# Patient Record
Sex: Female | Born: 1975 | Race: White | Hispanic: No | Marital: Married | Smoking: Current every day smoker
Health system: Southern US, Community
[De-identification: ages and names within clinical notes are randomized; demographics above are authoritative.]

## PROBLEM LIST (undated history)

## (undated) DIAGNOSIS — M5136 Other intervertebral disc degeneration, lumbar region: Secondary | ICD-10-CM

## (undated) DIAGNOSIS — M51369 Other intervertebral disc degeneration, lumbar region without mention of lumbar back pain or lower extremity pain: Secondary | ICD-10-CM

## (undated) HISTORY — PX: CARPAL TUNNEL RELEASE: SHX101

## (undated) HISTORY — PX: TUBAL LIGATION: SHX77

## (undated) HISTORY — PX: CERVIX LESION DESTRUCTION: SHX591

## (undated) HISTORY — PX: GASTRIC BYPASS: SHX52

---

## 2002-03-26 ENCOUNTER — Encounter: Admission: RE | Admit: 2002-03-26 | Discharge: 2002-03-26 | Payer: Self-pay | Admitting: *Deleted

## 2002-03-29 ENCOUNTER — Encounter: Admission: RE | Admit: 2002-03-29 | Discharge: 2002-06-27 | Payer: Self-pay | Admitting: *Deleted

## 2002-06-28 ENCOUNTER — Encounter: Admission: RE | Admit: 2002-06-28 | Discharge: 2002-06-28 | Payer: Self-pay | Admitting: *Deleted

## 2002-07-02 ENCOUNTER — Inpatient Hospital Stay (HOSPITAL_COMMUNITY): Admission: RE | Admit: 2002-07-02 | Discharge: 2002-07-04 | Payer: Self-pay | Admitting: *Deleted

## 2002-07-11 ENCOUNTER — Encounter: Admission: RE | Admit: 2002-07-11 | Discharge: 2002-10-09 | Payer: Self-pay | Admitting: *Deleted

## 2012-10-24 ENCOUNTER — Other Ambulatory Visit: Payer: Self-pay

## 2013-01-15 ENCOUNTER — Other Ambulatory Visit (HOSPITAL_COMMUNITY): Payer: Self-pay | Admitting: Specialist

## 2013-01-15 DIAGNOSIS — IMO0002 Reserved for concepts with insufficient information to code with codable children: Secondary | ICD-10-CM

## 2013-01-25 ENCOUNTER — Other Ambulatory Visit (HOSPITAL_COMMUNITY): Payer: Self-pay | Admitting: Specialist

## 2013-01-25 ENCOUNTER — Ambulatory Visit (HOSPITAL_COMMUNITY)
Admission: RE | Admit: 2013-01-25 | Discharge: 2013-01-25 | Disposition: A | Discharge status: Home / Self Care | Payer: Medicaid Other | Source: Ambulatory Visit | Attending: Specialist | Admitting: Specialist

## 2013-01-25 ENCOUNTER — Encounter (HOSPITAL_COMMUNITY): Payer: Self-pay

## 2013-01-25 ENCOUNTER — Other Ambulatory Visit (HOSPITAL_COMMUNITY): Payer: Self-pay | Admitting: Maternal and Fetal Medicine

## 2013-01-25 DIAGNOSIS — O359XX0 Maternal care for (suspected) fetal abnormality and damage, unspecified, not applicable or unspecified: Secondary | ICD-10-CM

## 2013-01-25 DIAGNOSIS — Z8751 Personal history of pre-term labor: Secondary | ICD-10-CM | POA: Insufficient documentation

## 2013-01-25 DIAGNOSIS — O99340 Other mental disorders complicating pregnancy, unspecified trimester: Secondary | ICD-10-CM | POA: Insufficient documentation

## 2013-01-25 DIAGNOSIS — O99810 Abnormal glucose complicating pregnancy: Secondary | ICD-10-CM | POA: Insufficient documentation

## 2013-01-25 DIAGNOSIS — F1010 Alcohol abuse, uncomplicated: Secondary | ICD-10-CM | POA: Insufficient documentation

## 2013-01-25 DIAGNOSIS — IMO0002 Reserved for concepts with insufficient information to code with codable children: Secondary | ICD-10-CM

## 2013-01-25 DIAGNOSIS — O99330 Smoking (tobacco) complicating pregnancy, unspecified trimester: Secondary | ICD-10-CM | POA: Insufficient documentation

## 2013-01-25 DIAGNOSIS — O09299 Supervision of pregnancy with other poor reproductive or obstetric history, unspecified trimester: Secondary | ICD-10-CM | POA: Insufficient documentation

## 2013-01-25 DIAGNOSIS — O358XX0 Maternal care for other (suspected) fetal abnormality and damage, not applicable or unspecified: Secondary | ICD-10-CM | POA: Insufficient documentation

## 2013-01-25 DIAGNOSIS — O09529 Supervision of elderly multigravida, unspecified trimester: Secondary | ICD-10-CM | POA: Insufficient documentation

## 2013-01-29 ENCOUNTER — Ambulatory Visit (HOSPITAL_COMMUNITY)
Admission: RE | Admit: 2013-01-29 | Discharge: 2013-01-29 | Disposition: A | Discharge status: Home / Self Care | Payer: Medicaid Other | Source: Ambulatory Visit | Attending: Specialist | Admitting: Specialist

## 2013-01-29 ENCOUNTER — Other Ambulatory Visit (HOSPITAL_COMMUNITY): Payer: Self-pay | Admitting: Maternal and Fetal Medicine

## 2013-01-29 DIAGNOSIS — O99340 Other mental disorders complicating pregnancy, unspecified trimester: Secondary | ICD-10-CM | POA: Insufficient documentation

## 2013-01-29 DIAGNOSIS — O99810 Abnormal glucose complicating pregnancy: Secondary | ICD-10-CM | POA: Insufficient documentation

## 2013-01-29 DIAGNOSIS — O99330 Smoking (tobacco) complicating pregnancy, unspecified trimester: Secondary | ICD-10-CM | POA: Insufficient documentation

## 2013-01-29 DIAGNOSIS — O09299 Supervision of pregnancy with other poor reproductive or obstetric history, unspecified trimester: Secondary | ICD-10-CM | POA: Insufficient documentation

## 2013-01-29 DIAGNOSIS — O359XX0 Maternal care for (suspected) fetal abnormality and damage, unspecified, not applicable or unspecified: Secondary | ICD-10-CM

## 2013-01-29 DIAGNOSIS — O09529 Supervision of elderly multigravida, unspecified trimester: Secondary | ICD-10-CM | POA: Insufficient documentation

## 2013-01-29 DIAGNOSIS — Z8751 Personal history of pre-term labor: Secondary | ICD-10-CM | POA: Insufficient documentation

## 2013-01-29 DIAGNOSIS — O365990 Maternal care for other known or suspected poor fetal growth, unspecified trimester, not applicable or unspecified: Secondary | ICD-10-CM | POA: Insufficient documentation

## 2013-01-29 DIAGNOSIS — F1010 Alcohol abuse, uncomplicated: Secondary | ICD-10-CM | POA: Insufficient documentation

## 2013-02-01 ENCOUNTER — Encounter: Payer: Self-pay | Admitting: Specialist

## 2013-02-01 ENCOUNTER — Other Ambulatory Visit (HOSPITAL_COMMUNITY): Payer: Self-pay | Admitting: Specialist

## 2013-02-01 DIAGNOSIS — O09899 Supervision of other high risk pregnancies, unspecified trimester: Secondary | ICD-10-CM

## 2013-02-05 ENCOUNTER — Ambulatory Visit (HOSPITAL_COMMUNITY): Payer: Medicaid Other

## 2013-02-05 ENCOUNTER — Other Ambulatory Visit (HOSPITAL_COMMUNITY): Payer: Self-pay | Admitting: Specialist

## 2013-02-05 ENCOUNTER — Ambulatory Visit (HOSPITAL_COMMUNITY)
Admission: RE | Admit: 2013-02-05 | Discharge: 2013-02-05 | Disposition: A | Discharge status: Home / Self Care | Payer: Medicaid Other | Source: Ambulatory Visit | Attending: Specialist | Admitting: Specialist

## 2013-02-05 DIAGNOSIS — Z8751 Personal history of pre-term labor: Secondary | ICD-10-CM | POA: Insufficient documentation

## 2013-02-05 DIAGNOSIS — O99340 Other mental disorders complicating pregnancy, unspecified trimester: Secondary | ICD-10-CM | POA: Insufficient documentation

## 2013-02-05 DIAGNOSIS — O09299 Supervision of pregnancy with other poor reproductive or obstetric history, unspecified trimester: Secondary | ICD-10-CM | POA: Insufficient documentation

## 2013-02-05 DIAGNOSIS — O09899 Supervision of other high risk pregnancies, unspecified trimester: Secondary | ICD-10-CM

## 2013-02-05 DIAGNOSIS — O99330 Smoking (tobacco) complicating pregnancy, unspecified trimester: Secondary | ICD-10-CM | POA: Insufficient documentation

## 2013-02-05 DIAGNOSIS — O09529 Supervision of elderly multigravida, unspecified trimester: Secondary | ICD-10-CM | POA: Insufficient documentation

## 2013-02-05 DIAGNOSIS — O99810 Abnormal glucose complicating pregnancy: Secondary | ICD-10-CM | POA: Insufficient documentation

## 2013-02-05 DIAGNOSIS — F1010 Alcohol abuse, uncomplicated: Secondary | ICD-10-CM | POA: Insufficient documentation

## 2013-02-05 DIAGNOSIS — O365990 Maternal care for other known or suspected poor fetal growth, unspecified trimester, not applicable or unspecified: Secondary | ICD-10-CM | POA: Insufficient documentation

## 2013-02-12 ENCOUNTER — Other Ambulatory Visit (HOSPITAL_COMMUNITY): Payer: Medicaid Other

## 2013-02-12 ENCOUNTER — Ambulatory Visit (HOSPITAL_COMMUNITY): Payer: Medicaid Other

## 2013-11-05 ENCOUNTER — Encounter (HOSPITAL_COMMUNITY): Payer: Self-pay

## 2013-11-30 ENCOUNTER — Encounter (HOSPITAL_COMMUNITY): Payer: Self-pay | Admitting: *Deleted

## 2015-04-08 ENCOUNTER — Emergency Department (HOSPITAL_COMMUNITY)
Admission: EM | Admit: 2015-04-08 | Discharge: 2015-04-08 | Disposition: A | Discharge status: Home / Self Care | Payer: PRIVATE HEALTH INSURANCE | Attending: Emergency Medicine | Admitting: Emergency Medicine

## 2015-04-08 ENCOUNTER — Encounter (HOSPITAL_COMMUNITY): Payer: Self-pay

## 2015-04-08 DIAGNOSIS — R509 Fever, unspecified: Secondary | ICD-10-CM | POA: Diagnosis present

## 2015-04-08 DIAGNOSIS — Z8739 Personal history of other diseases of the musculoskeletal system and connective tissue: Secondary | ICD-10-CM | POA: Diagnosis not present

## 2015-04-08 DIAGNOSIS — F17210 Nicotine dependence, cigarettes, uncomplicated: Secondary | ICD-10-CM | POA: Diagnosis not present

## 2015-04-08 DIAGNOSIS — J029 Acute pharyngitis, unspecified: Secondary | ICD-10-CM | POA: Insufficient documentation

## 2015-04-08 HISTORY — DX: Other intervertebral disc degeneration, lumbar region without mention of lumbar back pain or lower extremity pain: M51.369

## 2015-04-08 HISTORY — DX: Other intervertebral disc degeneration, lumbar region: M51.36

## 2015-04-08 LAB — RAPID STREP SCREEN (MED CTR MEBANE ONLY): Streptococcus, Group A Screen (Direct): NEGATIVE

## 2015-04-08 MED ORDER — DEXAMETHASONE 10 MG/ML FOR PEDIATRIC ORAL USE
10.0000 mg | Freq: Once | INTRAMUSCULAR | Status: AC
  Administered 2015-04-08: 10 mg via ORAL
  Filled 2015-04-08: qty 1

## 2015-04-08 MED ORDER — LIDOCAINE VISCOUS 2 % MT SOLN
15.0000 mL | Freq: Once | OROMUCOSAL | Status: AC
  Administered 2015-04-08: 15 mL via OROMUCOSAL

## 2015-04-08 MED ORDER — DEXAMETHASONE 1 MG/ML PO CONC
10.0000 mg | Freq: Once | ORAL | Status: DC
  Filled 2015-04-08: qty 10

## 2015-04-08 MED ORDER — ACETAMINOPHEN 325 MG PO TABS
650.0000 mg | ORAL_TABLET | Freq: Once | ORAL | Status: AC
  Administered 2015-04-08: 650 mg via ORAL
  Filled 2015-04-08: qty 2

## 2015-04-08 MED ORDER — LIDOCAINE VISCOUS 2 % MT SOLN
15.0000 mL | Freq: Once | OROMUCOSAL | Status: DC
  Filled 2015-04-08: qty 15

## 2015-04-08 NOTE — ED Notes (Signed)
PA at bedside.

## 2015-04-08 NOTE — ED Notes (Signed)
Discharge instructions and follow up care reviewed with patient. Patient verbalized understanding. 

## 2015-04-08 NOTE — ED Notes (Signed)
Patient c/o fever, sore throat, generalized body aches since yesterday.

## 2015-04-08 NOTE — Discharge Instructions (Signed)

## 2015-04-08 NOTE — ED Provider Notes (Signed)
CSN: [id number]     Arrival date & time 04/08/15  1551 History   First MD Initiated Contact with Patient 04/08/15 1615     Chief Complaint  Patient presents with  . Sore Throat  . Fever  . Generalized Body Aches   HPI Comments: 40 year old female who presents with a sore throat and fever for the past 2 days. She reports associated generalized malaise, headache, chills. Denies ear pain, congestion, rhinorrhea, chest pain, shortness of breath, abdominal pain, nausea, vomiting, diarrhea, dysuria. Possible sick contacts. Has been taking motrin  TID with some relief (last dose at 2pm)  Patient is a 40 y.o. female presenting with pharyngitis and fever.  Sore Throat Associated symptoms include chills, a fever, headaches and a sore throat. Pertinent negatives include no abdominal pain, chest pain, congestion, coughing, nausea, rash or vomiting.  Fever Associated symptoms: chills, headaches and sore throat   Associated symptoms: no chest pain, no congestion, no cough, no diarrhea, no dysuria, no ear pain, no nausea, no rash, no rhinorrhea and no vomiting     Past Medical History  Diagnosis Date  . Degenerative disc disease, lumbar    Past Surgical History  Procedure Laterality Date  . Gastric bypass    . Cervix lesion destruction    . Carpal tunnel release    . Cesarean section    . Tubal ligation     Family History  Problem Relation Age of Onset  . Hypertension Mother   . Diabetes Father    Social History  Substance Use Topics  . Smoking status: Current Every Day Smoker -- 1.00 packs/day    Types: Cigarettes  . Smokeless tobacco: Never Used  . Alcohol Use: No     Comment: Sober x 1 year   OB History    Gravida Para Term Preterm AB TAB SAB Ectopic Multiple Living   1              Review of Systems  Constitutional: Positive for fever and chills.  HENT: Positive for sore throat. Negative for congestion, ear pain and rhinorrhea.   Respiratory: Negative for cough and  shortness of breath.   Cardiovascular: Negative for chest pain.  Gastrointestinal: Negative for nausea, vomiting, abdominal pain and diarrhea.  Genitourinary: Negative for dysuria.  Skin: Negative for rash.  Neurological: Positive for headaches.  All other systems reviewed and are negative.   Allergies  Review of patient's allergies indicates not on file.  Home Medications   Prior to Admission medications   Not on File   BP 129/83 mmHg  Pulse 117  Temp(Src) 99.8 F (37.7 C) (Oral)  Resp 16  SpO2 96%  LMP 03/29/2015  Breastfeeding? No   Physical Exam  Constitutional: She is oriented to person, place, and time. She appears well-developed and well-nourished. No distress.  HENT:  Head: Normocephalic and atraumatic.  Right Ear: Hearing, tympanic membrane, external ear and ear canal normal.  Left Ear: Hearing, tympanic membrane, external ear and ear canal normal.  Nose: No mucosal edema or rhinorrhea.  Mouth/Throat: Uvula is midline and mucous membranes are normal. Oropharyngeal exudate and posterior oropharyngeal erythema present. No posterior oropharyngeal edema or tonsillar abscesses.  Eyes: Pupils are equal, round, and reactive to light.  Pulmonary/Chest: Effort normal.  Neurological: She is alert and oriented to person, place, and time.  Skin: Skin is warm and dry.  Psychiatric: She has a normal mood and affect.    ED Course  Procedures (including critical care time) Labs Review  Labs Reviewed  RAPID STRE[facility] SCREEN ([facility])  CULTURE, GROUP A STREP [facility])   Pt has pharyngeal exudate and erythema - will obtain rapid strep  MDM   Final diagnoses:  Pharyngitis   40 year old female with sore throat and fever. Rapid strep was neg. Tylenol, viscous lidocaine, and decadron given. Recheck of vital signs show improvement of pulse from 117 to 105 and temp 99.8 to 99.6. Advised plenty of fluids, continue Motrin TID. Pt is NAD and non-toxic. Pt advised that rapid strep will  be cultured and she will be contacted if it is positive. Patient informed of clinical course, understand medical decision-making process, and agree with plan.    Bethel BornKelly Marie Gekas, PA-C 04/08/15 1812  Marily MemosJason Mesner, MD 04/10/15 1019

## 2015-04-10 LAB — CULTURE, GROUP A STREP (THRC)

## 2015-04-11 ENCOUNTER — Telehealth: Payer: Self-pay

## 2015-04-11 NOTE — Telephone Encounter (Signed)
Post ED Visit - Positive Culture Follow-up: Successful Patient Follow-Up  Culture assessed and recommendations reviewed by: []  Enzo BiNathan Batchelder, Pharm.D. []  Celedonio MiyamotoJeremy Frens, Pharm.D., BCPS []  Garvin FilaMike Maccia, Pharm.D. []  Georgina PillionElizabeth Martin, Pharm.D., BCPS []  FerryMinh Pham, [address]Pharm[address]D., BCPS, AAHIVP []  Estella HuskMichelle Turner, Pharm.D., BCPS, AAHIVP []  Tennis Mustassie Stewart, Pharm.D. []  Sherle Poeob Vincent, [address]Pharm[address] Cassie Delsa SaleStewart Pharm D Positive throat culture  [x]  Patient discharged without antimicrobial prescription and treatment is now indicated []  Organism is resistant to prescribed ED discharge antimicrobial []  Patient with positive blood cultures  Changes discussed with ED provider: Fayrene HelperBowie Tran PA-C New antibiotic prescription Amoxicillin 1000mg  daily x 10 days [facility] [phone]  Contacted patient, date 04/11/2015, time 1028   Lenzie Sandler, Linnell FullingRose Burnett 04/11/2015, 10:26 AM

## 2015-04-11 NOTE — Progress Notes (Signed)
ED Antimicrobial Stewardship Positive Culture Follow Up   Lisa Conrad is an 40 y.o. female [facility] with a chief complaint of  Chief Complaint  Patient presents with  . Sore Throat  . Fever  . Generalized Body Aches    Recent Results (from the past 720 hour(s))  Rapid strep screen     Status: None   Collection Time: 04/08/15  4:31 PM  Result Value Ref Range Status   Streptococcus, Group A Screen (Direct) NEGATIVE NEGATIVE Final    Comment: (NOTE) A Rapid Antigen test may result negative if the antigen level in the sample is below the detection level of this test. The FDA has not cleared this test as a stand-alone test therefore the rapid antigen negative result has reflexed to a Group A Strep culture.   Culture, group A strep     Status: None   Collection Time: 04/08/15  4:31 PM  Result Value Ref Range Status   Specimen Description THROAT  Final   Special Requests NONE Reflexed from [id number]  Final   Culture FEW GROUP A STREP (S.PYOGENES) ISOLATED  Final   Report Status 04/10/2015 FINAL  Final    [x]  Patient discharged originally without antimicrobial agent and treatment is now indicated  New antibiotic prescription: Amoxicillin 1000 mg PO daily x 10 days  ED Provider: Fayrene HelperBowie Tran, [facility]  Renata CapriceMeredith Tilley 04/11/2015, 9:08 AM PharmD Candidate  I agree with the plan and assessment above.  Cassie L. Roseanne RenoStewart, PharmD PGY2 Infectious Diseases Pharmacy Resident Pager: [phone] 04/11/2015 9:12 AM

## 2015-11-04 ENCOUNTER — Encounter (HOSPITAL_COMMUNITY): Payer: Self-pay | Admitting: Emergency Medicine

## 2015-11-04 ENCOUNTER — Emergency Department (HOSPITAL_COMMUNITY): Payer: Medicaid Other

## 2015-11-04 ENCOUNTER — Emergency Department (HOSPITAL_COMMUNITY)
Admission: EM | Admit: 2015-11-04 | Discharge: 2015-11-04 | Disposition: A | Discharge status: Home / Self Care | Payer: Medicaid Other | Attending: Emergency Medicine | Admitting: Emergency Medicine

## 2015-11-04 DIAGNOSIS — M7918 Myalgia, other site: Secondary | ICD-10-CM

## 2015-11-04 DIAGNOSIS — F17210 Nicotine dependence, cigarettes, uncomplicated: Secondary | ICD-10-CM | POA: Insufficient documentation

## 2015-11-04 DIAGNOSIS — Y929 Unspecified place or not applicable: Secondary | ICD-10-CM | POA: Diagnosis not present

## 2015-11-04 DIAGNOSIS — Z79899 Other long term (current) drug therapy: Secondary | ICD-10-CM | POA: Insufficient documentation

## 2015-11-04 DIAGNOSIS — M791 Myalgia: Secondary | ICD-10-CM | POA: Insufficient documentation

## 2015-11-04 DIAGNOSIS — Y939 Activity, unspecified: Secondary | ICD-10-CM | POA: Insufficient documentation

## 2015-11-04 DIAGNOSIS — X58XXXA Exposure to other specified factors, initial encounter: Secondary | ICD-10-CM | POA: Diagnosis not present

## 2015-11-04 DIAGNOSIS — M546 Pain in thoracic spine: Secondary | ICD-10-CM | POA: Insufficient documentation

## 2015-11-04 DIAGNOSIS — Y999 Unspecified external cause status: Secondary | ICD-10-CM | POA: Insufficient documentation

## 2015-11-04 MED ORDER — CYCLOBENZAPRINE HCL 5 MG PO TABS: 5.0000 mg | ORAL_TABLET | Freq: Three times a day (TID) | ORAL | 0 refills | Status: AC | PRN

## 2015-11-04 MED ORDER — CYCLOBENZAPRINE HCL 10 MG PO TABS
5.0000 mg | ORAL_TABLET | Freq: Once | ORAL | Status: AC
Start: 2015-11-04 — End: 2015-11-04
  Administered 2015-11-04: 5 mg via ORAL
  Filled 2015-11-04: qty 1

## 2015-11-04 MED ORDER — KETOROLAC TROMETHAMINE 60 MG/2ML IM SOLN
60.0000 mg | Freq: Once | INTRAMUSCULAR | Status: AC
  Administered 2015-11-04: 60 mg via INTRAMUSCULAR
  Filled 2015-11-04: qty 2

## 2015-11-04 MED ORDER — NAPROXEN 500 MG PO TABS: 500.0000 mg | ORAL_TABLET | Freq: Two times a day (BID) | ORAL | 0 refills | Status: AC

## 2015-11-04 NOTE — Discharge Instructions (Signed)
Read the information below.  Your x-ray was re-assuring. This may be a muscle strain. I have prescribed naprosyn and flexeril. While taking napsoryn do not take other NSAIDs (motrin, ibuprofen, or aleve). Flexeril can make you drowsy, do not drive after taking.  You can also apply heat/ice to affected area.  If symptoms persist please follow up with a primary provider or orthopedics. I have provided the contact information above for both.  Use the prescribed medication as directed.  Please discuss all new medications with your pharmacist.   You may return to the Emergency Department at any time for worsening condition or any new symptoms that concern you. Return if you develop fever, uncontrolled pain, weakness, loss of bowel or bladder function, chest pain, shortness of breath, or any other new/concerning symptoms.

## 2015-11-04 NOTE — ED Provider Notes (Signed)
[facility]-EMERGENCY DEPT Provider Note   CSN: [id number] Arrival date & time: 11/04/15  1204  By signing my name below, I, Majel Homer, attest that this documentation has been prepared under the direction and in the presence of non-physician practitioner, Arvilla Meres, PA-C. Electronically Signed: Majel Homer, Scribe. 11/04/2015. 1:17 PM.  History   Chief Complaint Chief Complaint  Patient presents with  . Back Pain  . Arm Pain   The history is provided by the patient. No language interpreter was used.   HPI Comments: Lisa Conrad is a 40 y.o. female with PMHx of degenerative disc disease and chronic back pain, who presents to the Emergency Department complaining of gradually worsening, "sharp," constant, mid-thoracic back pain that began yesterday. Pt reports she was reaching for an object on the floor yesterday when she suddenly began to experience right sided, upper back pain and "tingling" in her right hand. She notes her pain is exacerbated with movement and when taking a deep breath. She reports she has taken ibuprofen with no relief. Pt states she is not currently followed by a specialist for her chronic back pain. She denies chest pain, shortness of breath, weakness, numbness, dizziness, lightheadedness, fever, cough, nasal congestion, rhinorrhea, difficulty swallowing, visual disturbance, rashes, lower leg swelling, urinary or bowel incontinence, recent long travel/surgery/hospitalization, cough, hemoptysis, h/o cancer, use of contraceptives, and hx of PE.    Past Medical History:  Diagnosis Date  . Degenerative disc disease, lumbar    There are no active problems to display for this patient.  Past Surgical History:  Procedure Laterality Date  . CARPAL TUNNEL RELEASE    . CERVIX LESION DESTRUCTION    . CESAREAN SECTION    . GASTRIC BYPASS    . TUBAL LIGATION      OB History    Gravida Para Term Preterm AB Living   1             SAB TAB Ectopic Multiple Live Births                Home Medications    Prior to Admission medications   Medication Sig Start Date End Date Taking? Authorizing Provider  cyclobenzaprine (FLEXERIL) 5 MG tablet Take 1 tablet (5 mg total) by mouth 3 (three) times daily as needed for muscle spasms. 11/04/15   Lona Kettle, PA-C  gabapentin (NEURONTIN) 300 MG capsule Take 300 mg by mouth 2 (two) times daily.    Historical Provider, MD  ibuprofen (ADVIL,MOTRIN) 800 MG tablet Take 800 mg by mouth 3 (three) times daily.    Historical Provider, MD  naproxen (NAPROSYN) 500 MG tablet Take 1 tablet (500 mg total) by mouth 2 (two) times daily. 11/04/15   Lona Kettle, PA-C  traZODone (DESYREL) 100 MG tablet Take 100 mg by mouth at bedtime.    Historical Provider, MD    Family History Family History  Problem Relation Age of Onset  . Hypertension Mother   . Diabetes Father     Social History Social History  Substance Use Topics  . Smoking status: Current Every Day Smoker    Packs/day: 1.00    Types: Cigarettes  . Smokeless tobacco: Never Used  . Alcohol use No     Comment: Sober x 1 year   Allergies   Review of patient's allergies indicates no known allergies.  Review of Systems Review of Systems  Constitutional: Negative for fever.  HENT: Negative for congestion, rhinorrhea and trouble swallowing.   Eyes: Negative for  visual disturbance.  Respiratory: Negative for cough and shortness of breath.   Cardiovascular: Negative for chest pain and leg swelling.  Musculoskeletal: Positive for back pain.  Skin: Negative for rash.  Neurological: Negative for dizziness, weakness, light-headedness and numbness.   Physical Exam Updated Vital Signs BP 132/89   Pulse 86   Temp 97.7 F (36.5 C) (Oral)   Resp 16   Ht 5\' 1"  (1.549 m)   Wt 156 lb (70.8 kg)   SpO2 100%   BMI 29.48 kg/m   Physical Exam  Constitutional: She is oriented to person, place, and time. She appears well-developed and well-nourished. No  distress.  HENT:  Head: Normocephalic and atraumatic.  Eyes: Conjunctivae are normal. No scleral icterus.  Neck: Normal range of motion. Muscular tenderness present. No spinous process tenderness present. No neck rigidity. Normal range of motion present.  Cardiovascular: Normal rate, regular rhythm, normal heart sounds and intact distal pulses.   No murmur heard. Pulmonary/Chest: Effort normal and breath sounds normal. No respiratory distress. She has no wheezes. She has no rales.  Musculoskeletal: She exhibits tenderness. She exhibits no deformity.  No midline spinal tenderness, no step offs, no obvious deformity, tenderness along right trapezius muscle that is worse in mid-thoracic back; ROM, strength, and sensation intact; compartments soft; intact DP pulses.  Lymphadenopathy:    She has no cervical adenopathy.  Neurological: She is alert and oriented to person, place, and time. She displays normal reflexes. No cranial nerve deficit. She exhibits normal muscle tone. Coordination normal.  Mental Status:  Alert, thought content appropriate, able to give a coherent history. Speech fluent without evidence of aphasia. Able to follow 2 step commands without difficulty.  Cranial Nerves:  II:  Peripheral visual fields grossly normal, pupils equal, round, reactive to light III,IV, VI: ptosis not present, extra-ocular motions intact bilaterally  V,VII: smile symmetric, facial light touch sensation equal VIII: hearing grossly normal to voice  X: uvula elevates symmetrically  XI: bilateral shoulder shrug symmetric and strong XII: midline tongue extension without fassiculations Motor:  Normal tone. 5/5 in upper and lower extremities bilaterally including strong and equal grip strength and dorsiflexion/plantar flexion Sensory: light touch normal in all extremities. Cerebellar: normal finger-to-nose with bilateral upper extremities Gait: normal gait and balance CV: distal pulses palpable throughout     Skin: Skin is warm and dry. She is not diaphoretic.  Psychiatric: She has a normal mood and affect. Her behavior is normal.   ED Treatments / Results  Labs (all labs ordered are listed, but only abnormal results are displayed) Labs Reviewed - No data to display  EKG  EKG Interpretation None      Radiology Dg Ribs Unilateral W/chest Right  Result Date: 11/04/2015 CLINICAL DATA:  Initial evaluation for acute right posterior rib pain with right arm numbness. EXAM: RIGHT RIBS AND CHEST - 3+ VIEW COMPARISON:  None. FINDINGS: Cardiac and mediastinal silhouettes are within normal limits. Lungs are normally inflated. No focal infiltrate, pulmonary edema, or pleural effusion. No pneumothorax. Dedicated views of the right-sided ribs were performed. Metallic marker BB overlies the lower right chest, presumably at site of pain. No acute rib fracture or other osseous abnormality identified. Visualized soft tissues within normal limits peer cholecystectomy clips noted. IMPRESSION: 1. No acute rib fracture or other osseous abnormality identified. 2. No active cardiopulmonary disease. Electronically Signed   By: Rise MuBenjamin  McClintock M.D.   On: 11/04/2015 14:40   Procedures Procedures (including critical care time)  Medications Ordered in ED  Medications  cyclobenzaprine (FLEXERIL) tablet 5 mg (5 mg Oral Given 11/04/15 1327)  ketorolac (TORADOL) injection 60 mg (60 mg Intramuscular Given 11/04/15 1327)    DIAGNOSTIC STUDIES:  Oxygen Saturation is 100% on RA, normal by my interpretation.    COORDINATION OF CARE:  1:03 PM Discussed treatment plan with pt at bedside and pt agreed to plan.  Initial Impression / Assessment and Plan / ED Course  I have reviewed the triage vital signs and the nursing notes.  Pertinent labs & imaging results that were available during my care of the patient were reviewed by me and considered in my medical decision making (see chart for details).  Clinical Course   Value Comment By Time   On re-evaluation patient endorses improvement in pain.  Lona Kettleshley Laurel Desmin Daleo, [place]PA-C 10/31 1552  DG Ribs Unilateral W/Chest Right Normal cardiac silhouette. No evidence of consolidation, effusion, or PTX. No free air under diaphragm.  Lona Kettleshley Laurel Zyeir Dymek, [place]PA-C 10/31 1552    Patient presents to ED with complaint of mid-thoracic back pain x 1 day. Patient is afebrile and non-toxic appearing in NAD. VSS. No fever, h/o cancer, IVDU. No midline spinal tenderness, deformity, or step off. TTP of right trapezius that is worse at level of mid-thoracic back. No focal neurologic deficits on exam. Patient is ambulatory. X-ray normal. Pain managed in ED. Suspect MSK in nature. Discussed results and plan with patient. Supportive care discussed. Rx naprosyn and flexeril. Follow up with ortho or PCP if sxs persist. Return precautions given. Patient voiced understanding and is agreeable.   I personally performed the services described in this documentation, which was scribed in my presence. The recorded information has been reviewed and is accurate.   Final Clinical Impressions(s) / ED Diagnoses   Final diagnoses:  Musculoskeletal pain  Acute right-sided thoracic back pain     New Prescriptions Discharge Medication List as of 11/04/2015  3:57 PM    START taking these medications   Details  cyclobenzaprine (FLEXERIL) 5 MG tablet Take 1 tablet (5 mg total) by mouth 3 (three) times daily as needed for muscle spasms., Starting Tue 11/04/2015, Print    naproxen (NAPROSYN) 500 MG tablet Take 1 tablet (500 mg total) by mouth 2 (two) times daily., Starting Tue 11/04/2015, Print         [place] Corner[place]ley Laurel Cardarius Senat, PA-C 11/04/15 1749    Jacalyn LefevreJulie Haviland, MD 11/04/15 [phone]

## 2015-11-04 NOTE — ED Triage Notes (Signed)
Pt has hx of cervical disc problems. Pt reached for something yesterday and began to have pain up her back and R arm numbness. Has had this problem before on her L side. No obvious injury.

## 2018-01-09 IMAGING — CR DG RIBS W/ CHEST 3+V*R*
3 series · 3 of 3 positions shown · non-contrast
Comparison: None.

CLINICAL DATA: Initial evaluation for acute right posterior rib
pain with right arm numbness.

EXAM:
RIGHT RIBS AND CHEST - 3+ VIEW

[w chest pa (1 of 3)]
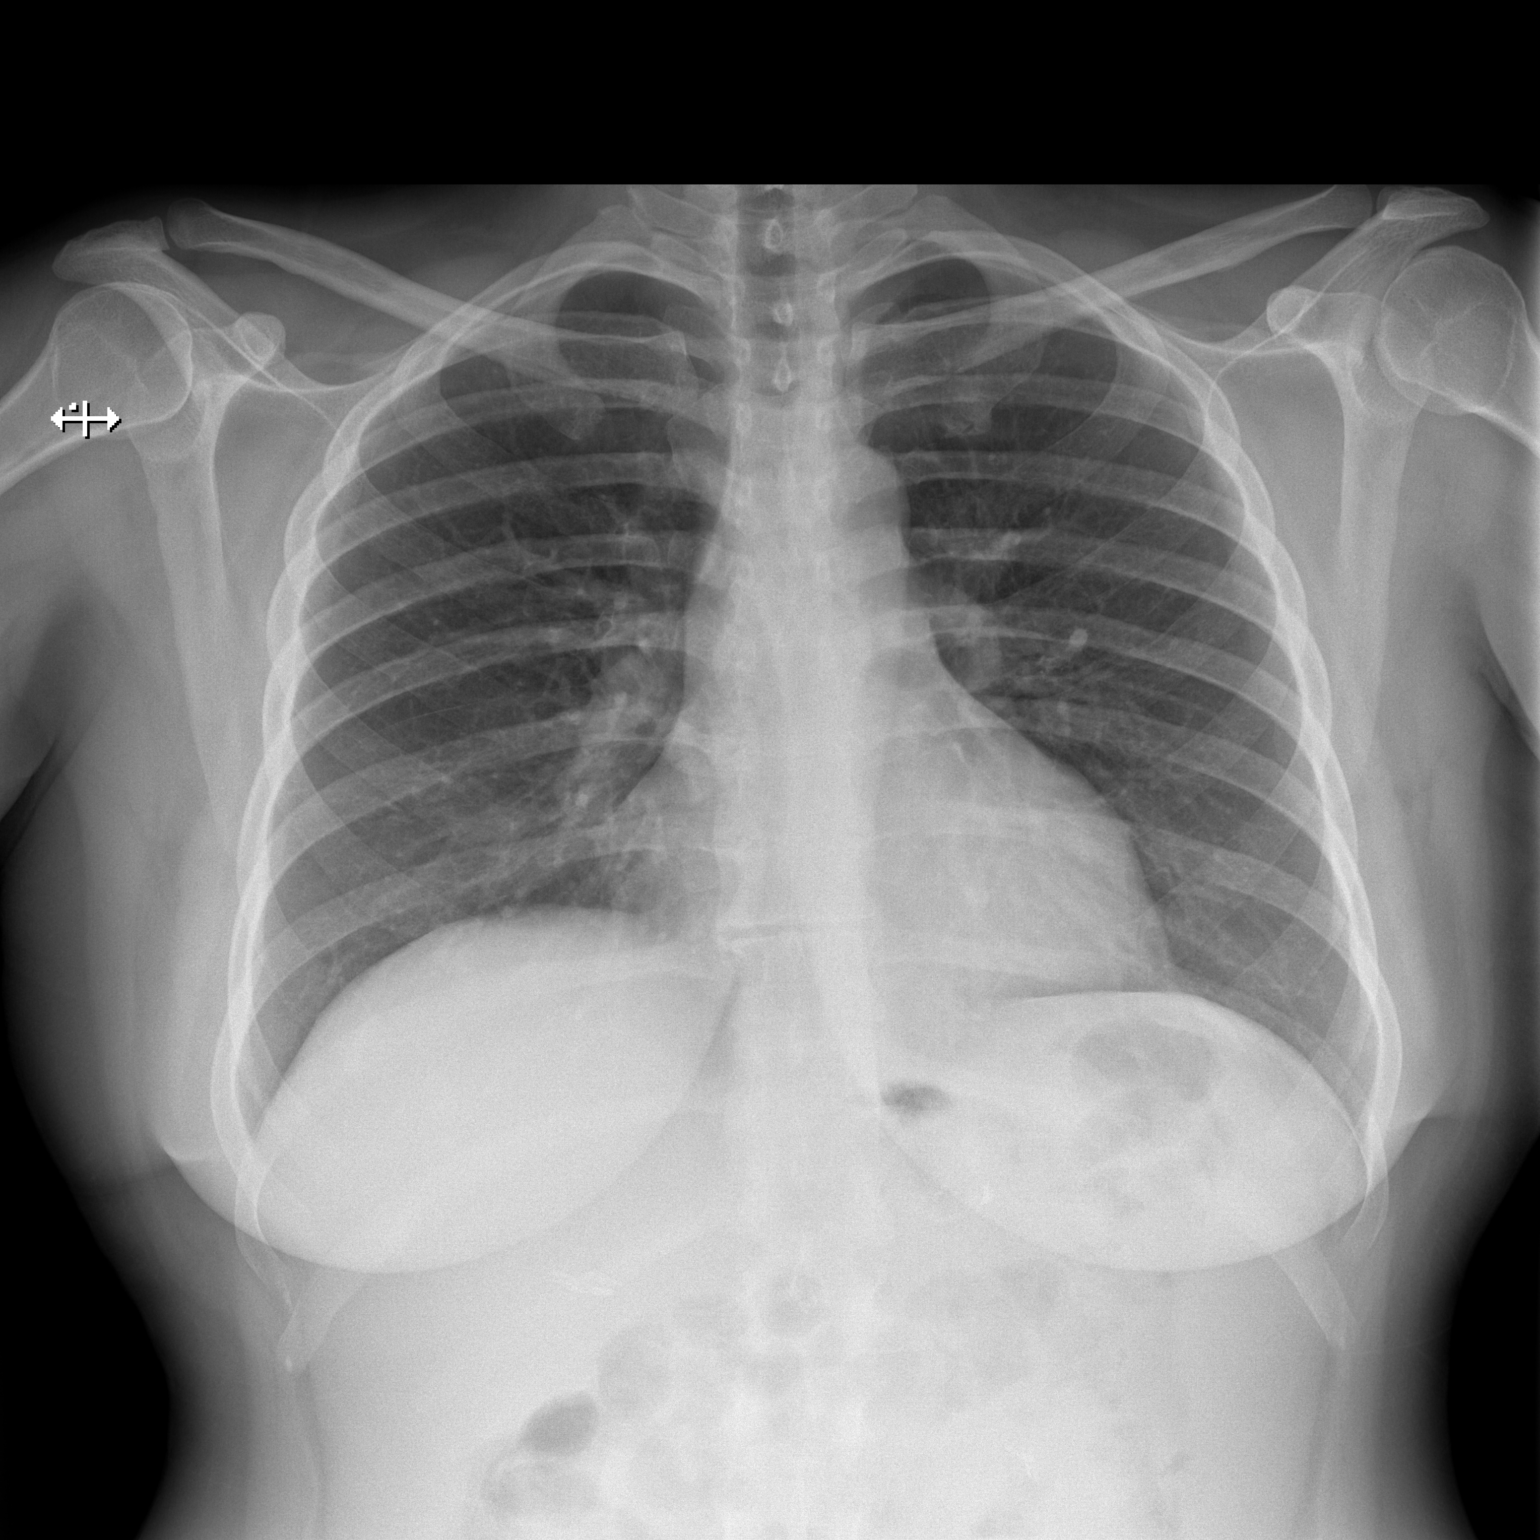

[w chest pa (2 of 3)]
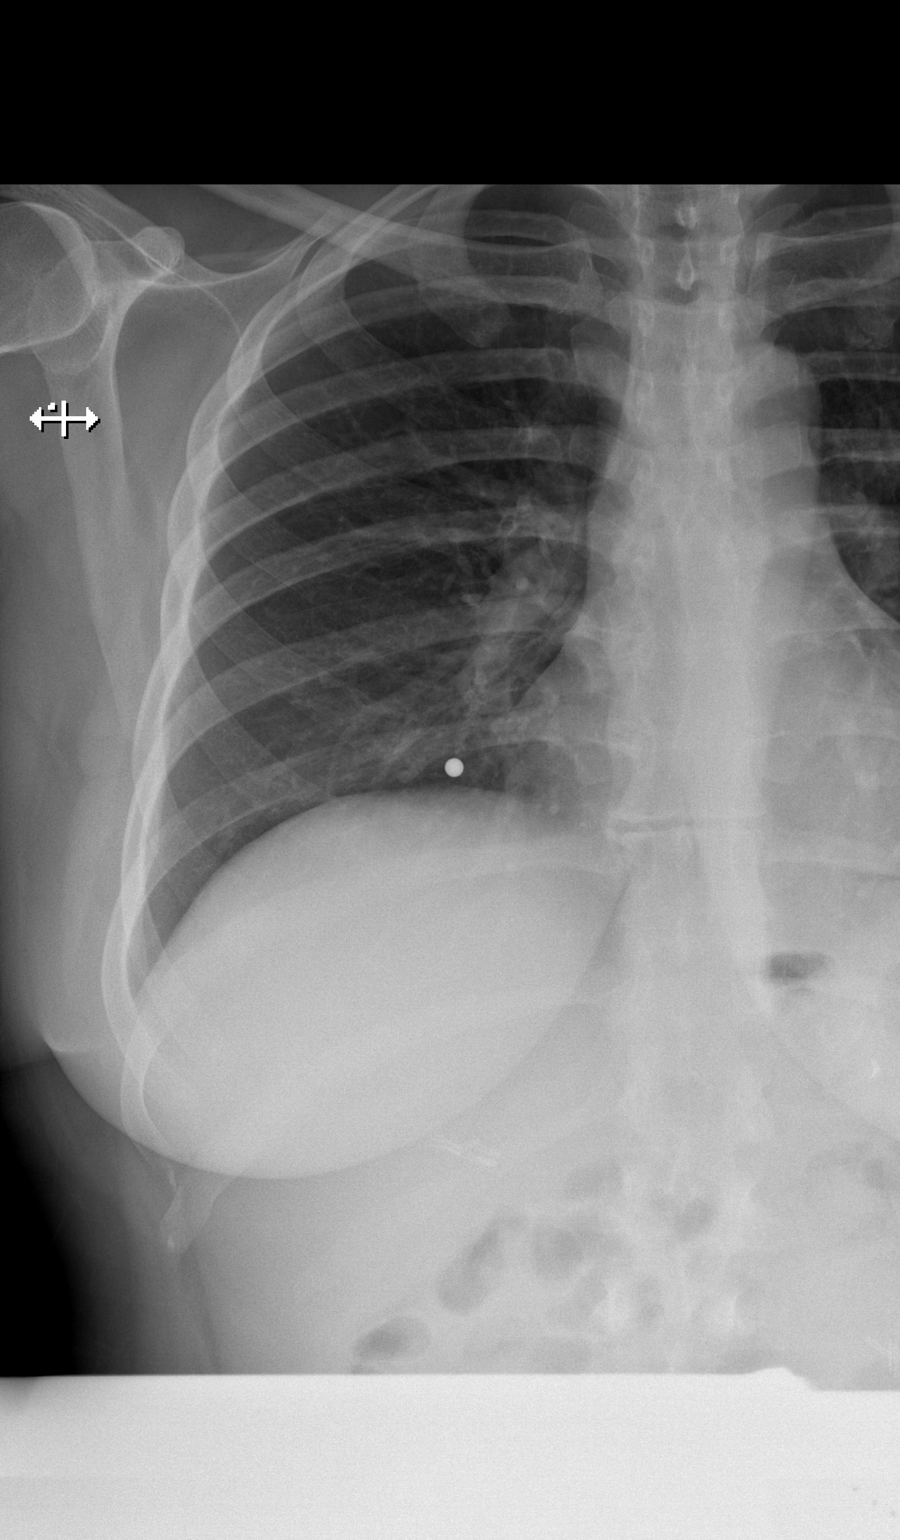

[w chest pa (3 of 3)]
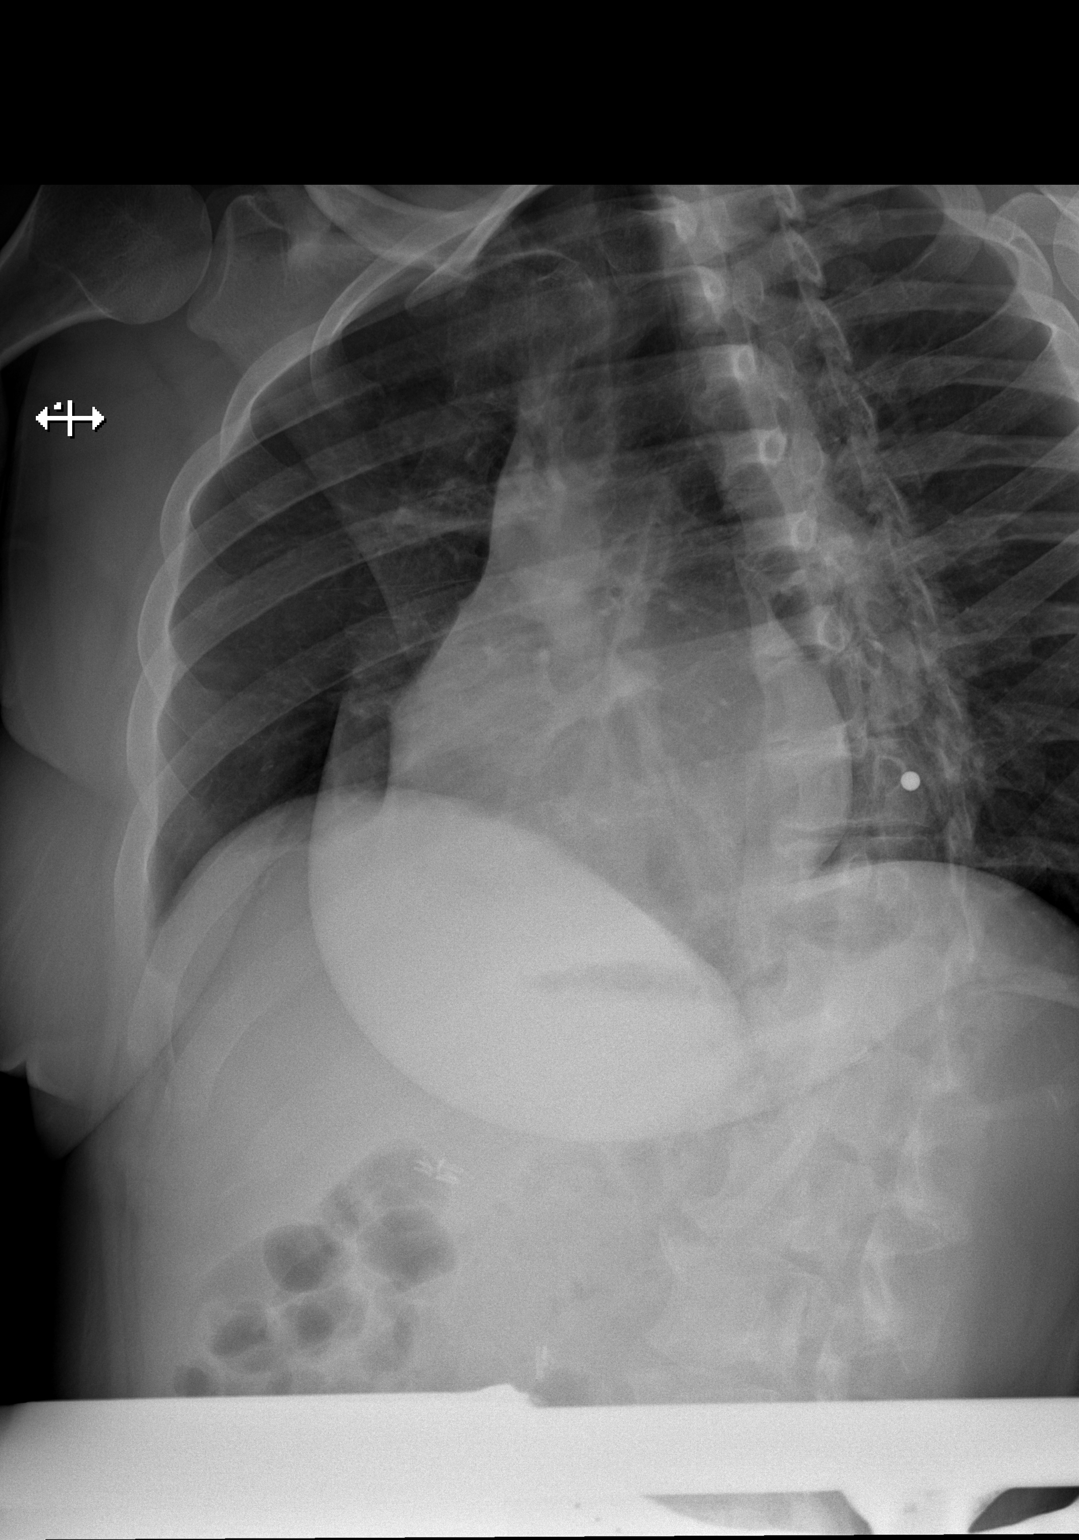

[3 of 3 positions shown; findings below may reference images not displayed]

FINDINGS: Cardiac and mediastinal silhouettes are within normal limits.

Lungs are normally inflated. No focal infiltrate, pulmonary edema,
or pleural effusion. No pneumothorax.

Dedicated views of the right-sided ribs were performed. Metallic
marker BB overlies the lower right chest, presumably at site of
pain. No acute rib fracture or other osseous abnormality identified.

Visualized soft tissues within normal limits peer cholecystectomy
clips noted.
IMPRESSION: 1. No acute rib fracture or other osseous abnormality identified.
2. No active cardiopulmonary disease.
